# Patient Record
Sex: Female | Born: 1960 | Race: Black or African American | Hispanic: No | Smoking: Never smoker
Health system: Southern US, Community
[De-identification: ages and names within clinical notes are randomized; demographics above are authoritative.]

## PROBLEM LIST (undated history)

## (undated) DIAGNOSIS — Z9889 Other specified postprocedural states: Secondary | ICD-10-CM

## (undated) DIAGNOSIS — K219 Gastro-esophageal reflux disease without esophagitis: Secondary | ICD-10-CM

## (undated) DIAGNOSIS — I10 Essential (primary) hypertension: Secondary | ICD-10-CM

## (undated) DIAGNOSIS — R112 Nausea with vomiting, unspecified: Secondary | ICD-10-CM

## (undated) HISTORY — PX: CARPAL TUNNEL RELEASE: SHX101

## (undated) HISTORY — PX: ABDOMINAL HYSTERECTOMY: SHX81

## (undated) HISTORY — PX: PARTIAL KNEE ARTHROPLASTY: SHX2174

## (undated) HISTORY — PX: NASAL SINUS SURGERY: SHX719

## (undated) HISTORY — PX: THUMB ARTHROSCOPY: SHX2509

---

## 2004-06-13 ENCOUNTER — Ambulatory Visit: Payer: Self-pay

## 2005-09-04 ENCOUNTER — Ambulatory Visit: Payer: Self-pay | Admitting: Otolaryngology

## 2006-12-03 ENCOUNTER — Ambulatory Visit: Payer: Self-pay | Admitting: Specialist

## 2006-12-10 ENCOUNTER — Ambulatory Visit: Payer: Self-pay | Admitting: Specialist

## 2010-04-03 ENCOUNTER — Ambulatory Visit: Payer: Self-pay | Admitting: Internal Medicine

## 2011-01-17 ENCOUNTER — Ambulatory Visit: Payer: Self-pay | Admitting: Rheumatology

## 2011-02-13 ENCOUNTER — Ambulatory Visit: Payer: Self-pay | Admitting: Unknown Physician Specialty

## 2011-07-08 ENCOUNTER — Ambulatory Visit: Payer: Self-pay | Admitting: Unknown Physician Specialty

## 2011-07-31 ENCOUNTER — Ambulatory Visit: Payer: Self-pay | Admitting: Unknown Physician Specialty

## 2012-10-15 ENCOUNTER — Ambulatory Visit: Payer: Self-pay | Admitting: Specialist

## 2012-10-15 LAB — POTASSIUM: Potassium: 3.5 mmol/L (ref 3.5–5.1)

## 2012-10-22 ENCOUNTER — Ambulatory Visit: Payer: Self-pay | Admitting: Specialist

## 2012-11-17 ENCOUNTER — Encounter: Payer: Self-pay | Admitting: Specialist

## 2012-12-08 ENCOUNTER — Encounter: Payer: Self-pay | Admitting: Specialist

## 2013-01-07 ENCOUNTER — Encounter: Payer: Self-pay | Admitting: Specialist

## 2014-11-14 ENCOUNTER — Ambulatory Visit (INDEPENDENT_AMBULATORY_CARE_PROVIDER_SITE_OTHER): Payer: Federal, State, Local not specified - PPO

## 2014-11-14 ENCOUNTER — Ambulatory Visit (INDEPENDENT_AMBULATORY_CARE_PROVIDER_SITE_OTHER): Payer: Federal, State, Local not specified - PPO | Admitting: Podiatry

## 2014-11-14 ENCOUNTER — Encounter: Payer: Self-pay | Admitting: Podiatry

## 2014-11-14 VITALS — BP 151/84 | HR 62 | Resp 16

## 2014-11-14 DIAGNOSIS — M79671 Pain in right foot: Secondary | ICD-10-CM

## 2014-11-14 DIAGNOSIS — M722 Plantar fascial fibromatosis: Secondary | ICD-10-CM | POA: Diagnosis not present

## 2014-11-14 DIAGNOSIS — M779 Enthesopathy, unspecified: Secondary | ICD-10-CM

## 2014-11-14 NOTE — Progress Notes (Signed)
She presents today for follow-up of capsulitis sub-second metatarsophalangeal joint of the right foot. She states there is doing good for a while and now my foot is starting to hurt again. She denies any changes in her past medical history medications allergy surgery social history or trauma to the foot.  Objective: Vital signs are stable she is alert and oriented 3. Pulses are palpable bilateral. She is pain on palpation and in range of motion of the second metatarsophalangeal joint of the right foot. Radiographic evaluation confirms elongated second metatarsal with soft tissue increase in density periarticular.  Assessment: Capsulitis second metatarsophalangeal joint right foot.  Plan: Injected the joint today with 2 mg of dexamethasone and local anesthetic after sterile Betadine skin prep. She was also scanned for pair of orthotics.

## 2014-12-12 ENCOUNTER — Ambulatory Visit (INDEPENDENT_AMBULATORY_CARE_PROVIDER_SITE_OTHER): Payer: Federal, State, Local not specified - PPO | Admitting: Podiatry

## 2014-12-12 DIAGNOSIS — M779 Enthesopathy, unspecified: Secondary | ICD-10-CM

## 2014-12-12 DIAGNOSIS — M722 Plantar fascial fibromatosis: Secondary | ICD-10-CM | POA: Diagnosis not present

## 2014-12-12 NOTE — Progress Notes (Signed)
Orthotics dispensed. Slow breakin. See in 1 mo. For follow.  Objective evaluation demonstrates palpable pulses. She still has tenderness on palpation of the second metatarsophalangeal joint of the right foot.  Assessment: Capsulitis right foot second metatarsophalangeal joint.  Plan: Dispensed orthotics today and will follow-up with her in 1 month at which time reaming a need to reinject the joint.

## 2014-12-12 NOTE — Patient Instructions (Signed)

## 2014-12-30 NOTE — Op Note (Signed)
PATIENT NAME:  Marland KitchenJENKINS, Shirley M MR#:  [id number] DATE OF BIRTH:  12-23-1960  DATE OF PROCEDURE:  10/22/2012  PREOPERATIVE DIAGNOSIS: Right carpal tunnel syndrome.   POSTOPERATIVE DIAGNOSIS: Right carpal tunnel syndrome with excess synovitis.  OPERATION: Right carpal tunnel release with partial synovectomy.   SURGEON: Valinda HoarHoward E. Cyan Clippinger, M.D.   ANESTHESIA: General LMA.   COMPLICATIONS: None.   DRAINS: None.   DESCRIPTION OF PROCEDURE: The patient was brought to the operating room where she underwent satisfactory general LMA anesthesia, in the supine position. The right arm was prepped and draped in sterile fashion. Esmarch was applied and the tourniquet inflated to 250 mmHg. Tourniquet time was 22 minutes. A longitudinal incision was made in the palm using the patient's long palmar crease. Dissection was carried out bluntly through subcutaneous tissue using loop magnification. The distal aspect of the volar carpal ligament was identified and a Kelly clamp passed beneath it. The soft tissues were elevated off the volar ligament and the mini blade knife was used to release the ligament distally. Carpal tunnel scissors were used proximally under direct vision. The nerve was seen to be adhesed. A mosquito clamp was used to free this up from adhesions. In addition, there excess synovitis around the flexor tendons and a portion of this was excised. Motor branch was intact. The wound was then irrigated and closed with running 5-0 nylon suture. 0.5% Marcaine was placed in the wound and a dry sterile compression hand dressing with volar splint was applied. The tourniquet was deflated with good return of blood flow to the hand. The patient was awakened and taken to recovery in good condition. ____________________________ Valinda HoarHoward E. Haevyn Ury, MD hem:sb D: 10/22/2012 08:44:09 ET     T: 10/22/2012 09:07:01 ET         JOB#: [id number] cc: Valinda HoarHoward E. Mayleigh Tetrault, MD, <Dictator> Valinda HoarHOWARD E Anitra Doxtater MD ELECTRONICALLY SIGNED  10/23/2012 15:12

## 2015-01-11 ENCOUNTER — Ambulatory Visit (INDEPENDENT_AMBULATORY_CARE_PROVIDER_SITE_OTHER): Payer: Federal, State, Local not specified - PPO | Admitting: Podiatry

## 2015-01-11 ENCOUNTER — Encounter: Payer: Self-pay | Admitting: Podiatry

## 2015-01-11 VITALS — BP 135/77 | HR 64 | Resp 16

## 2015-01-11 DIAGNOSIS — M779 Enthesopathy, unspecified: Secondary | ICD-10-CM | POA: Diagnosis not present

## 2015-01-11 NOTE — Progress Notes (Signed)
She presents today for follow-up of capsulitis second metatarsophalangeal joint of the right foot she states this seems to be doing much better. She states that the orthotics are comfortable and she wears and the majority of the time. She did not bring them with her today.  Objective: Vital signs are stable she is alert and oriented 3 much decrease in edema and erythema to the second metatarsophalangeal joint of the right foot mild tenderness on dorsiflexion plantar flexion and there is some fluid that is palpable within the joint.  Assessment well-healing capsulitis second metatarsophalangeal joint right foot.  Plan: Continue use of the orthotics and anti-inflammatories follow up with me as needed.

## 2015-01-30 ENCOUNTER — Other Ambulatory Visit: Payer: Self-pay

## 2015-01-31 ENCOUNTER — Other Ambulatory Visit: Payer: Self-pay

## 2015-01-31 ENCOUNTER — Encounter: Payer: Self-pay | Admitting: *Deleted

## 2015-01-31 DIAGNOSIS — I10 Essential (primary) hypertension: Secondary | ICD-10-CM | POA: Diagnosis not present

## 2015-01-31 DIAGNOSIS — Z882 Allergy status to sulfonamides status: Secondary | ICD-10-CM | POA: Diagnosis not present

## 2015-01-31 DIAGNOSIS — K219 Gastro-esophageal reflux disease without esophagitis: Secondary | ICD-10-CM | POA: Diagnosis not present

## 2015-01-31 DIAGNOSIS — G5631 Lesion of radial nerve, right upper limb: Secondary | ICD-10-CM | POA: Diagnosis not present

## 2015-01-31 DIAGNOSIS — Z880 Allergy status to penicillin: Secondary | ICD-10-CM | POA: Diagnosis not present

## 2015-01-31 NOTE — Patient Instructions (Signed)
  Your procedure is scheduled on: 02-08-15 Report to [facility] 2ND FLOOR To find out your arrival time please call [phone] between 1PM - 3PM on 02-07-15 (TUESDAY).  Remember: Instructions that are not followed completely may result in serious medical risk, up to and including death, or upon the discretion of your surgeon and anesthesiologist your surgery may need to be rescheduled.    __X__ 1. Do not eat food or drink liquids after midnight. No gum chewing or hard candies.     __X__ 2. No Alcohol for 24 hours before or after surgery.   ____ 3. Bring all medications with you on the day of surgery if instructed.    __X__ 4. Notify your doctor if there is any change in your medical condition     (cold, fever, infections).     Do not wear jewelry, make-up, hairpins, clips or nail polish.  Do not wear lotions, powders, or perfumes. You may wear deodorant.  Do not shave 48 hours prior to surgery. Men may shave face and neck.  Do not bring valuables to the hospital.    [facility] is not responsible for any belongings or valuables.               Contacts, dentures or bridgework may not be worn into surgery.  Leave your suitcase in the car. After surgery it may be brought to your room.  For patients admitted to the hospital, discharge time is determined by your treatment team.   Patients discharged the day of surgery will not be allowed to drive home.   Please read over the following fact sheets that you were given:    CHG INSTRUCTIONS  ____ Take these medicines the morning of surgery with A SIP OF WATER: NONE   1.  2.   3.   4.  5.  6.  ____ Fleet Enema (as directed)   __X__ Use CHG Soap as directed  ____ Use inhalers on the day of surgery  ____ Stop metformin 2 days prior to surgery    ____ Take 1/2 of usual insulin dose the night before surgery and none on the morning of surgery.   _X___ Stop Coumadin/Plavix/aspirin NOW  _X__ Stop  Anti-inflammatories NOW (MOBIC)-NO NSAIDS OR ASPIRIN PRODUCTS (TYLENOL OK)   _X___ Stop supplements until after surgery.  (FISH OIL)  ____ Bring C-Pap to the hospital.

## 2015-02-02 ENCOUNTER — Encounter
Admission: RE | Admit: 2015-02-02 | Discharge: 2015-02-02 | Disposition: A | Discharge status: Home / Self Care | Payer: Federal, State, Local not specified - PPO | Source: Ambulatory Visit | Attending: Anesthesiology | Admitting: Anesthesiology

## 2015-02-02 DIAGNOSIS — Z01810 Encounter for preprocedural cardiovascular examination: Secondary | ICD-10-CM | POA: Insufficient documentation

## 2015-02-02 DIAGNOSIS — I10 Essential (primary) hypertension: Secondary | ICD-10-CM | POA: Diagnosis not present

## 2015-02-02 DIAGNOSIS — G5631 Lesion of radial nerve, right upper limb: Secondary | ICD-10-CM | POA: Diagnosis not present

## 2015-02-02 LAB — POTASSIUM: POTASSIUM: 3.3 mmol/L — AB (ref 3.5–5.1)

## 2015-02-03 NOTE — OR Nursing (Signed)
pts potassium 3.3. Called Tabitha at [facility] and left her a message regarding low potassium and faxed result over to office

## 2015-02-13 ENCOUNTER — Ambulatory Visit: Payer: Federal, State, Local not specified - PPO | Admitting: Anesthesiology

## 2015-02-13 ENCOUNTER — Ambulatory Visit
Admission: RE | Admit: 2015-02-13 | Discharge: 2015-02-13 | Disposition: A | Discharge status: Home / Self Care | Payer: Federal, State, Local not specified - PPO | Source: Ambulatory Visit | Attending: Specialist | Admitting: Specialist

## 2015-02-13 ENCOUNTER — Encounter
Admission: RE | Disposition: A | Discharge status: Home / Self Care | Payer: Self-pay | Source: Ambulatory Visit | Attending: Specialist

## 2015-02-13 DIAGNOSIS — G5631 Lesion of radial nerve, right upper limb: Secondary | ICD-10-CM | POA: Diagnosis not present

## 2015-02-13 DIAGNOSIS — I10 Essential (primary) hypertension: Secondary | ICD-10-CM | POA: Insufficient documentation

## 2015-02-13 DIAGNOSIS — Z882 Allergy status to sulfonamides status: Secondary | ICD-10-CM | POA: Insufficient documentation

## 2015-02-13 DIAGNOSIS — K219 Gastro-esophageal reflux disease without esophagitis: Secondary | ICD-10-CM | POA: Insufficient documentation

## 2015-02-13 DIAGNOSIS — Z880 Allergy status to penicillin: Secondary | ICD-10-CM | POA: Insufficient documentation

## 2015-02-13 HISTORY — PX: NERVE REPAIR: SHX2083

## 2015-02-13 HISTORY — DX: Other specified postprocedural states: Z98.890

## 2015-02-13 HISTORY — DX: Essential (primary) hypertension: I10

## 2015-02-13 HISTORY — DX: Nausea with vomiting, unspecified: R11.2

## 2015-02-13 HISTORY — DX: Gastro-esophageal reflux disease without esophagitis: K21.9

## 2015-02-13 LAB — POTASSIUM: Potassium, Ser: 3.7

## 2015-02-13 SURGERY — REPAIR, NERVE
Anesthesia: General | Laterality: Right | Wound class: Clean

## 2015-02-13 MED ORDER — ONDANSETRON HCL 4 MG/2ML IJ SOLN
INTRAMUSCULAR | Status: DC | PRN
  Administered 2015-02-13: 4 mg via INTRAVENOUS

## 2015-02-13 MED ORDER — ATENOLOL 50 MG PO TABS
ORAL_TABLET | ORAL | Status: AC
  Filled 2015-02-13: qty 1

## 2015-02-13 MED ORDER — PHENYLEPHRINE HCL 10 MG/ML IJ SOLN
INTRAMUSCULAR | Status: DC | PRN
  Administered 2015-02-13: 100 ug via INTRAVENOUS

## 2015-02-13 MED ORDER — TRAMADOL HCL 50 MG PO TABS: 50.0000 mg | ORAL_TABLET | Freq: Four times a day (QID) | ORAL | Status: DC | PRN

## 2015-02-13 MED ORDER — GABAPENTIN 400 MG PO CAPS: 400.0000 mg | ORAL_CAPSULE | Freq: Three times a day (TID) | ORAL | Status: DC

## 2015-02-13 MED ORDER — MIDAZOLAM HCL 2 MG/2ML IJ SOLN
INTRAMUSCULAR | Status: DC | PRN
  Administered 2015-02-13: 2 mg via INTRAVENOUS

## 2015-02-13 MED ORDER — ATENOLOL 50 MG PO TABS
25.0000 mg | ORAL_TABLET | Freq: Once | ORAL | Status: AC
  Administered 2015-02-13: 14:00:00 via ORAL

## 2015-02-13 MED ORDER — FENTANYL CITRATE (PF) 100 MCG/2ML IJ SOLN
INTRAMUSCULAR | Status: AC
  Filled 2015-02-13: qty 2

## 2015-02-13 MED ORDER — CLINDAMYCIN PHOSPHATE 900 MG/50ML IV SOLN
INTRAVENOUS | Status: AC
Start: 2015-02-13 — End: 2015-02-13
  Administered 2015-02-13: 900 mg via INTRAVENOUS
  Filled 2015-02-13: qty 50

## 2015-02-13 MED ORDER — ONDANSETRON HCL 4 MG/2ML IJ SOLN
INTRAMUSCULAR | Status: AC
  Administered 2015-02-13: 4 mg via INTRAVENOUS
  Filled 2015-02-13: qty 2

## 2015-02-13 MED ORDER — CLINDAMYCIN PHOSPHATE 900 MG/50ML IV SOLN
900.0000 mg | Freq: Once | INTRAVENOUS | Status: AC
  Administered 2015-02-13: 900 mg via INTRAVENOUS

## 2015-02-13 MED ORDER — LIDOCAINE HCL (CARDIAC) 20 MG/ML IV SOLN
INTRAVENOUS | Status: DC | PRN
  Administered 2015-02-13: 50 mg via INTRAVENOUS

## 2015-02-13 MED ORDER — BUPIVACAINE HCL (PF) 0.5 % IJ SOLN
INTRAMUSCULAR | Status: AC
  Filled 2015-02-13: qty 30

## 2015-02-13 MED ORDER — BUPIVACAINE HCL 0.5 % IJ SOLN
INTRAMUSCULAR | Status: DC | PRN
  Administered 2015-02-13: 20 mL

## 2015-02-13 MED ORDER — FAMOTIDINE 20 MG PO TABS
ORAL_TABLET | ORAL | Status: AC
  Administered 2015-02-13: 20 mg via ORAL
  Filled 2015-02-13: qty 1

## 2015-02-13 MED ORDER — GABAPENTIN 400 MG PO CAPS
400.0000 mg | ORAL_CAPSULE | Freq: Once | ORAL | Status: AC
  Administered 2015-02-13: 400 mg via ORAL

## 2015-02-13 MED ORDER — MELOXICAM 7.5 MG PO TABS
ORAL_TABLET | ORAL | Status: AC
  Administered 2015-02-13: 7.5 mg via ORAL
  Filled 2015-02-13: qty 2

## 2015-02-13 MED ORDER — GLYCOPYRROLATE 0.2 MG/ML IJ SOLN
INTRAMUSCULAR | Status: DC | PRN
  Administered 2015-02-13: 0.2 mg via INTRAVENOUS

## 2015-02-13 MED ORDER — GABAPENTIN 400 MG PO CAPS
ORAL_CAPSULE | ORAL | Status: AC
  Administered 2015-02-13: 400 mg via ORAL
  Filled 2015-02-13: qty 1

## 2015-02-13 MED ORDER — PROPOFOL 10 MG/ML IV BOLUS
INTRAVENOUS | Status: DC | PRN
  Administered 2015-02-13: 150 mg via INTRAVENOUS

## 2015-02-13 MED ORDER — DEXAMETHASONE SODIUM PHOSPHATE 4 MG/ML IJ SOLN
INTRAMUSCULAR | Status: DC | PRN
  Administered 2015-02-13: 5 mg via INTRAVENOUS

## 2015-02-13 MED ORDER — LACTATED RINGERS IV SOLN
INTRAVENOUS | Status: DC | PRN
  Administered 2015-02-13 (×2): via INTRAVENOUS

## 2015-02-13 MED ORDER — FENTANYL CITRATE (PF) 100 MCG/2ML IJ SOLN
INTRAMUSCULAR | Status: DC | PRN
  Administered 2015-02-13 (×3): 50 ug via INTRAVENOUS

## 2015-02-13 MED ORDER — LACTATED RINGERS IV SOLN
Freq: Once | INTRAVENOUS | Status: AC
  Administered 2015-02-13: 14:00:00 via INTRAVENOUS

## 2015-02-13 MED ORDER — EPHEDRINE SULFATE 50 MG/ML IJ SOLN
INTRAMUSCULAR | Status: DC | PRN
  Administered 2015-02-13: 10 mg via INTRAVENOUS

## 2015-02-13 MED ORDER — BUPIVACAINE HCL (PF) 0.25 % IJ SOLN
INTRAMUSCULAR | Status: AC
  Filled 2015-02-13: qty 30

## 2015-02-13 MED ORDER — ONDANSETRON HCL 4 MG/2ML IJ SOLN
4.0000 mg | Freq: Once | INTRAMUSCULAR | Status: AC | PRN
  Administered 2015-02-13: 4 mg via INTRAVENOUS

## 2015-02-13 MED ORDER — MELOXICAM 7.5 MG PO TABS
15.0000 mg | ORAL_TABLET | Freq: Once | ORAL | Status: AC
  Administered 2015-02-13: 7.5 mg via ORAL

## 2015-02-13 MED ORDER — NEOMYCIN-POLYMYXIN B GU 40-200000 IR SOLN
Status: DC | PRN
  Administered 2015-02-13: 2 mL

## 2015-02-13 MED ORDER — FENTANYL CITRATE (PF) 100 MCG/2ML IJ SOLN
25.0000 ug | INTRAMUSCULAR | Status: DC | PRN
  Administered 2015-02-13 (×4): 25 ug via INTRAVENOUS

## 2015-02-13 MED ORDER — FAMOTIDINE 20 MG PO TABS
20.0000 mg | ORAL_TABLET | Freq: Once | ORAL | Status: AC
  Administered 2015-02-13: 20 mg via ORAL

## 2015-02-13 SURGICAL SUPPLY — 22 items
BLADE SURG MINI STRL (BLADE) ×3 IMPLANT
BNDG ESMARK 4X12 TAN STRL LF (GAUZE/BANDAGES/DRESSINGS) ×3 IMPLANT
CHLORAPREP W/TINT 26ML (MISCELLANEOUS) ×3 IMPLANT
GAUZE FLUFF 18X24 1PLY STRL (GAUZE/BANDAGES/DRESSINGS) ×3 IMPLANT
GAUZE PETRO XEROFOAM 1X8 (MISCELLANEOUS) ×3 IMPLANT
GLOVE BIO SURGEON STRL SZ7.5 (GLOVE) ×3 IMPLANT
GOWN STRL REUS W/ TWL LRG LVL3 (GOWN DISPOSABLE) ×2 IMPLANT
GOWN STRL REUS W/TWL LRG LVL3 (GOWN DISPOSABLE) ×4
KIT RM TURNOVER STRD PROC AR (KITS) ×3 IMPLANT
NS IRRIG 500ML POUR BTL (IV SOLUTION) ×3 IMPLANT
PACK EXTREMITY ARMC (MISCELLANEOUS) ×3 IMPLANT
PAD PREP 24X41 OB/GYN DISP (PERSONAL CARE ITEMS) ×3 IMPLANT
PADDING CAST 4IN STRL (MISCELLANEOUS) ×4
PADDING CAST BLEND 4X4 STRL (MISCELLANEOUS) ×2 IMPLANT
SPLINT CAST 1 STEP 4X30 (MISCELLANEOUS) ×3 IMPLANT
STAPLER SKIN PROX 35W (STAPLE) ×3 IMPLANT
STOCKINETTE BIAS CUT 4 980044 (GAUZE/BANDAGES/DRESSINGS) ×3 IMPLANT
STOCKINETTE STRL 4IN 9604848 (GAUZE/BANDAGES/DRESSINGS) ×3 IMPLANT
SUT MNCRL+ 5-0 VIOLET P-3 (SUTURE) IMPLANT
SUT MONOCRYL 5-0 (SUTURE)
SUT VIC AB 3-0 SH 27 (SUTURE) ×2
SUT VIC AB 3-0 SH 27X BRD (SUTURE) ×1 IMPLANT

## 2015-02-13 NOTE — Transfer of Care (Signed)
Immediate Anesthesia Transfer of Care Note  Patient: Shirley KitchenSheena M Kaiser  Procedure(s) Performed: Procedure(s) with comments: NERVE REPAIR (Right) - radial nerve  Patient Location: PACU  Anesthesia Type:General  Level of Consciousness: Alert, Awake, Oriented  Airway & Oxygen Therapy: Patient Spontanous Breathing  Post-op Assessment: Report given to RN  Post vital signs: Reviewed and stable  Last Vitals:  Filed Vitals:   02/13/15 1639  BP:   Pulse: 67  Temp: 36.8 C  Resp: 18    Complications: No apparent anesthesia complications

## 2015-02-13 NOTE — Anesthesia Postprocedure Evaluation (Signed)
  Anesthesia Post-op Note  Patient: Marland KitchenSheena M Kaiser  Procedure(s) Performed: Procedure(s) with comments: NERVE REPAIR (Right) - radial nerve  Anesthesia type:General  Patient location: PACU  Post pain: Pain level controlled  Post assessment: Post-op Vital signs reviewed, Patient's Cardiovascular Status Stable, Respiratory Function Stable, Patent Airway and No signs of Nausea or vomiting  Post vital signs: Reviewed and stable  Last Vitals:  Filed Vitals:   02/13/15 1639  BP:   Pulse: 67  Temp: 36.8 C  Resp: 18    Level of consciousness: awake, alert  and patient cooperative  Complications: No apparent anesthesia complications

## 2015-02-13 NOTE — OR Nursing (Signed)
Patient able to move fingers

## 2015-02-13 NOTE — Anesthesia Preprocedure Evaluation (Signed)
Anesthesia Evaluation  Patient identified by MRN, date of birth, ID band Patient awake    Reviewed: Allergy & Precautions, NPO status , Patient's Chart, lab work & pertinent test results  History of Anesthesia Complications (+) PONV  Airway Mallampati: II  TM Distance: >3 FB Neck ROM: Full    Dental  (+) Partial Lower   Pulmonary          Cardiovascular hypertension, Pt. on medications and Pt. on home beta blockers     Neuro/Psych    GI/Hepatic GERD- (improved, no meds x 2 yrs)  ,  Endo/Other    Renal/GU      Musculoskeletal   Abdominal   Peds  Hematology   Anesthesia Other Findings   Reproductive/Obstetrics                             Anesthesia Physical Anesthesia Plan  ASA: II  Anesthesia Plan: General   Post-op Pain Management:    Induction: Intravenous  Airway Management Planned: LMA  Additional Equipment:   Intra-op Plan:   Post-operative Plan:   Informed Consent: I have reviewed the patients History and Physical, chart, labs and discussed the procedure including the risks, benefits and alternatives for the proposed anesthesia with the patient or authorized representative who has indicated his/her understanding and acceptance.     Plan Discussed with:   Anesthesia Plan Comments:         Anesthesia Quick Evaluation

## 2015-02-13 NOTE — Op Note (Signed)
02/13/2015  4:35 PM  PATIENT:  Marland KitchenSheena M Danh  54 y.o. female  PRE-OPERATIVE DIAGNOSIS:  radial nerve compression  POST-OPERATIVE DIAGNOSIS:  Same  PROCEDURE: Release right radial nerve below elbow   SURGEON:  Lyrical Sowle E Jarica Plass MD   ANESTHESIA:   General  Complications:  None  Blood loss :None  Procedure: The patient was brought to the operating room and underwent general LMA anesthesia satisfactorily.  The right arm was prepped and draped in sterile fashion.  Esmarch was applied and tourniquet inflated to 250 mmHg.  A longitudinal incision was made over the dorsal radial aspect of the proximal forearm.  Dissection was carried out bluntly through subcutaneous tissue with protection of nerves.  Muscle fascia was incised and muscle compartment, separated.  Dissection was carried out bluntly deepened down to the radial nerve.  Both the sensory and motor branches were identified and were released from the point of split distally for several centimeters.  Tight fascia over the motor branch was released.  Blunt finger dissection more distally did not reveal any significant constriction.  The wound was then irrigated and closed with 30 Vicryls and staples.  Half percent Sensorcaine was instilled.  A dry sterile dressing was applied with a sugar tong splint.  Tourniquet time was 40 minutes.  The patient was placed in a sling and awakened and taken to recovery in good condition.  Valinda HoarHoward E Lorretta Kerce, M.D.   DICTATION #:  [id number] [id number]

## 2015-02-13 NOTE — Discharge Instructions (Signed)
AMBULATORY SURGERY  DISCHARGE INSTRUCTIONS   1) The drugs that you were given will stay in your system until tomorrow so for the next 24 hours you should not:  A) Drive an automobile B) Make any legal decisions C) Drink any alcoholic beverage   2) You may resume regular meals tomorrow.  Today it is better to start with liquids and gradually work up to solid foods.  You may eat anything you prefer, but it is better to start with liquids, then soup and crackers, and gradually work up to solid foods.   3) Please notify your doctor immediately if you have any unusual bleeding, trouble breathing, redness and pain at the surgery site, drainage, fever, or pain not relieved by medication.  4) Your post-operative visit with Dr.                                     is: Date:                        Time:    Please call to schedule your post-operative visit.  5) Additional Instructions:Elevate arm on two pillows. 6)

## 2015-02-13 NOTE — Anesthesia Procedure Notes (Signed)
Procedure Name: LMA Insertion Date/Time: 02/13/2015 3:30 PM Performed by: Mathews ArgyleLOGAN, Shirley Heckert Pre-anesthesia Checklist: Patient identified, Emergency Drugs available, Suction available and Patient being monitored Patient Re-evaluated:Patient Re-evaluated prior to inductionOxygen Delivery Method: Circle system utilized Preoxygenation: Pre-oxygenation with 100% oxygen Intubation Type: IV induction LMA: LMA inserted LMA Size: 4.0 Placement Confirmation: positive ETCO2 and breath sounds checked- equal and bilateral Tube secured with: Tape Dental Injury: Teeth and Oropharynx as per pre-operative assessment

## 2015-02-14 ENCOUNTER — Encounter: Payer: Self-pay | Admitting: Specialist

## 2015-02-20 ENCOUNTER — Ambulatory Visit (INDEPENDENT_AMBULATORY_CARE_PROVIDER_SITE_OTHER): Payer: Federal, State, Local not specified - PPO | Admitting: Podiatry

## 2015-02-20 ENCOUNTER — Encounter: Payer: Self-pay | Admitting: Podiatry

## 2015-02-20 VITALS — BP 104/85 | HR 57 | Resp 16

## 2015-02-20 DIAGNOSIS — M779 Enthesopathy, unspecified: Secondary | ICD-10-CM

## 2015-02-20 NOTE — Progress Notes (Signed)
She presents today out of work recovering from right elbow surgery. She states that my second knuckle still hurts. This will be the prime time to have an injection because I will be out of work.  Objective: No signs are stable she is alert and oriented 3. Pulses are palpable right foot. She has pain on palpation and in the range of motion of the second metatarsophalangeal joint of the right foot.  Assessment: Capsulitis second metatarsophalangeal joint right foot.  Plan: Injected the second metatarsophalangeal joint today with 2 mg of dexamethasone and local aspect. And we'll follow-up with her in 1 month.

## 2015-03-29 ENCOUNTER — Ambulatory Visit: Payer: Federal, State, Local not specified - PPO | Admitting: Podiatry

## 2015-04-05 ENCOUNTER — Ambulatory Visit (INDEPENDENT_AMBULATORY_CARE_PROVIDER_SITE_OTHER): Payer: Federal, State, Local not specified - PPO | Admitting: Podiatry

## 2015-04-05 VITALS — BP 125/87 | HR 69 | Resp 16

## 2015-04-05 DIAGNOSIS — M779 Enthesopathy, unspecified: Secondary | ICD-10-CM | POA: Diagnosis not present

## 2015-04-05 NOTE — Progress Notes (Signed)
She presents today for follow-up of her capsulitis second metatarsophalangeal joint of her right foot. She states that is not doing very well.  Objective: Vital signs are stable she's alert and oriented 3 pulses are palpable right. She has swelling over the second metatarsophalangeal joint of the right foot with palpable fluid retention. Pain on end range of motion of the second metatarsophalangeal joint.  Assessment: Capsulitis second metatarsophalangeal joint right foot chronic.  Plan: Local anesthesia was administered periarticular about the second metatarsophalangeal joint today the contents of the joint was aspirated after sterile Betadine skin prep and then reinjected with Kenalog. I will follow-up with her in 3-4 weeks.

## 2015-04-26 ENCOUNTER — Encounter: Payer: Self-pay | Admitting: Podiatry

## 2015-04-26 ENCOUNTER — Ambulatory Visit (INDEPENDENT_AMBULATORY_CARE_PROVIDER_SITE_OTHER): Payer: Federal, State, Local not specified - PPO | Admitting: Podiatry

## 2015-04-26 VITALS — BP 166/98 | HR 68 | Resp 16

## 2015-04-26 DIAGNOSIS — M779 Enthesopathy, unspecified: Secondary | ICD-10-CM

## 2015-04-26 NOTE — Progress Notes (Signed)
She presents today for a follow-up of her capsulitis second digit of the right foot. She states that is doing much better. She does not have to take any medications and presents today in flip flops.  Objective: Vital signs are stable she is alert and oriented 3. Pulses are strongly palpable. Neurologic sensorium is intact deep tendon reflexes are intact bilateral muscle strength +5 over 5 dorsiflexion plantar flexors and inverters everters all into the musculature is intact. Orthopedic evaluation demonstrates all joints of the ankle for range of motion without crepitation without pain. She has no pain on end range of motion of the second metatarsophalangeal joint of the right foot.  Assessment: Well-healing capsulitis second metatarsophalangeal joint right foot. No further treatment is necessary.  Plan: She will follow up with [place] on an as-needed basis.  Dr. Arbutus Ped

## 2015-12-25 ENCOUNTER — Ambulatory Visit (INDEPENDENT_AMBULATORY_CARE_PROVIDER_SITE_OTHER): Payer: Federal, State, Local not specified - PPO | Admitting: Podiatry

## 2015-12-25 ENCOUNTER — Ambulatory Visit (INDEPENDENT_AMBULATORY_CARE_PROVIDER_SITE_OTHER): Payer: Federal, State, Local not specified - PPO

## 2015-12-25 ENCOUNTER — Encounter: Payer: Self-pay | Admitting: Podiatry

## 2015-12-25 VITALS — BP 118/64 | HR 72 | Resp 12

## 2015-12-25 DIAGNOSIS — M722 Plantar fascial fibromatosis: Secondary | ICD-10-CM

## 2015-12-25 MED ORDER — MELOXICAM 15 MG PO TABS: 15.0000 mg | ORAL_TABLET | Freq: Every day | ORAL | Status: AC

## 2015-12-25 MED ORDER — METHYLPREDNISOLONE 4 MG PO TBPK: ORAL_TABLET | ORAL | Status: DC

## 2015-12-25 NOTE — Progress Notes (Signed)
She presents today with a 4 month duration of bilateral plantar foot pain. She's done nothing to treat it. She states that the majority of the pain first thing in the mornings and after she's been on her feet for extended period of time. She states that she has to wear house slippers or flip flops during her lunch hour.  Objective: Vital signs stable alert and oriented 3. Pulses are palpable. I have reviewed her past medical history medications allergies surgeon social history. She is pain on palpation medial calcaneal tubercle bilateral heels. Radiographs demonstrate pes planus with a soft tissue increase in density of the plantar fascial calcaneal insertion site. This is indicative of plantar fasciitis.  Assessment: Pain in limb secondary to plantar fasciitis and pes planus.  Plan: Injected bilateral heels today with Kenalog and local anesthetic started her on a Medrol Dosepak to be followed by meloxicam. Placed her in bilateral plantar fascial braces and a night splint. We discussed appropriate shoe gear stretching exercises ice therapy and sugar modifications area

## 2015-12-25 NOTE — Patient Instructions (Signed)

## 2016-01-24 ENCOUNTER — Encounter: Payer: Self-pay | Admitting: Podiatry

## 2016-01-24 ENCOUNTER — Ambulatory Visit (INDEPENDENT_AMBULATORY_CARE_PROVIDER_SITE_OTHER): Payer: Federal, State, Local not specified - PPO | Admitting: Podiatry

## 2016-01-24 DIAGNOSIS — M722 Plantar fascial fibromatosis: Secondary | ICD-10-CM

## 2016-01-24 DIAGNOSIS — M779 Enthesopathy, unspecified: Secondary | ICD-10-CM | POA: Diagnosis not present

## 2016-01-24 NOTE — Progress Notes (Signed)
She presents today for follow-up of her plantar fasciitis bilaterally. He states that her left wrist doing well her right foot however is still painful. She states that she has not been wearing her plantar fascia braces correctly.  Objective: Vital signs are stable she is alert and oriented 3. Pulses are palpable. His pain on palpation medial calcaneal tubercle of the right heel as well as tenderness on palpation of the second metatarsophalangeal joint right foot.  Assessment: Capsulitis second metatarsophalangeal joint right foot. Recurrence of plantar fascitis right heel.  Plan: Continue anti-inflammatories demonstrated the plantar fascial braces once again to her I injected dexamethasone second metatarsophalangeal joint as well as Kenalog to the right heel. Follow up with her in 4-6 weeks.

## 2016-02-28 ENCOUNTER — Ambulatory Visit: Payer: Federal, State, Local not specified - PPO | Admitting: Podiatry

## 2016-03-06 ENCOUNTER — Encounter: Payer: Self-pay | Admitting: Podiatry

## 2016-03-06 ENCOUNTER — Ambulatory Visit (INDEPENDENT_AMBULATORY_CARE_PROVIDER_SITE_OTHER): Payer: Federal, State, Local not specified - PPO | Admitting: Podiatry

## 2016-03-06 DIAGNOSIS — M722 Plantar fascial fibromatosis: Secondary | ICD-10-CM

## 2016-03-06 DIAGNOSIS — M779 Enthesopathy, unspecified: Secondary | ICD-10-CM

## 2016-03-06 MED ORDER — DICLOFENAC SODIUM 75 MG PO TBEC: 75.0000 mg | DELAYED_RELEASE_TABLET | Freq: Two times a day (BID) | ORAL | Status: DC

## 2016-03-06 NOTE — Progress Notes (Signed)
She presents today for follow-up of her plantar fasciitis bilaterally and capsulitis second metatarsophalangeal joint right foot. She states that the capsulitis is 100% resolved and her plantar fasciitis is a proximal 95% resolved. Her graft objective: Vital signs are stable she is alert and oriented 3. Pulses are palpable. She has pain on palpation medial continued tubercles bilaterally but much decreased from previous evaluations. No pain on palpation or range of motion of the second metatarsophalangeal joint of the right foot. No open lesions or wounds.  Assessment: Resolving plantar fasciitis 90-95% bilateral. Resolved capsulitis second metatarsophalangeal joint of the right foot.  Plan: I encouraged her to discontinue use of the plantar fascial brace and utilize her orthotics on a regular basis area also recommended that she start diclofenac which I prescribed for her today I will follow up with her in 1 month if she is not 100% improved.

## 2016-04-10 ENCOUNTER — Encounter: Payer: Self-pay | Admitting: Podiatry

## 2016-04-10 ENCOUNTER — Ambulatory Visit (INDEPENDENT_AMBULATORY_CARE_PROVIDER_SITE_OTHER): Payer: Federal, State, Local not specified - PPO | Admitting: Podiatry

## 2016-04-10 DIAGNOSIS — M722 Plantar fascial fibromatosis: Secondary | ICD-10-CM

## 2016-04-11 NOTE — Progress Notes (Signed)
She presents today complaining of continued pain to the right foot. Left foot she states is doing much better. She has had 2 injections to the right heel.  Objective: Vital signs are stable she is alert and oriented 3. Pulses are palpable. She has pain on palpation medially in a typical right heel.  Assessment: Plantar fasciitis right heel consistent.  Plan: This will be her third injection today we injected with Kenalog and local anesthetic right heel. Also suggested she continue all conservative therapies we discussed the possible need for shockwave therapy as well as EPF. I will follow-up with her in 1 month to 6 weeks.

## 2016-10-09 ENCOUNTER — Ambulatory Visit (INDEPENDENT_AMBULATORY_CARE_PROVIDER_SITE_OTHER): Payer: Federal, State, Local not specified - PPO | Admitting: Podiatry

## 2016-10-09 DIAGNOSIS — M722 Plantar fascial fibromatosis: Secondary | ICD-10-CM | POA: Diagnosis not present

## 2016-10-09 DIAGNOSIS — M779 Enthesopathy, unspecified: Secondary | ICD-10-CM | POA: Diagnosis not present

## 2016-10-09 NOTE — Progress Notes (Signed)
Presents today with chief complaint pain to the dorsolateral aspect of the right foot 2 weeks. States that she was taking a prednisone Dosepak her right hand and and she will begin her foot flared up become extremely painful and tapered off toward the end of the pack of pills. She states that right now seems to be doing pretty good she continues to wear her night splint at night.  Objective: Vital signs are stable alert and oriented 3. Pulses are palpable. Neurologic sensorium is intact. Deep tendon reflexes are intact she has pain from a plain range of motion at the level of the fourth fifth metatarsocuboid articulation she has pain with palpation me for continued tubercle of the right heel to lesser degree.  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  Plan: Encouraged her to continue her anti-inflammatories and might be. Follow up with me should this worsen.

## 2016-10-16 ENCOUNTER — Ambulatory Visit: Payer: Federal, State, Local not specified - PPO | Admitting: Podiatry

## 2016-12-16 ENCOUNTER — Encounter: Payer: Self-pay | Admitting: Podiatry

## 2016-12-16 ENCOUNTER — Ambulatory Visit (INDEPENDENT_AMBULATORY_CARE_PROVIDER_SITE_OTHER): Payer: Federal, State, Local not specified - PPO | Admitting: Podiatry

## 2016-12-16 ENCOUNTER — Ambulatory Visit: Payer: Federal, State, Local not specified - PPO | Admitting: Podiatry

## 2016-12-16 DIAGNOSIS — M722 Plantar fascial fibromatosis: Secondary | ICD-10-CM

## 2016-12-16 NOTE — Progress Notes (Signed)
She presents today states that both of her feet are killing her. She states that should've taken the shot last time he offered to me.  Objective: Vital signs are stable she is alert and oriented 3. Both of my heels are hurting.  She has pain on palpation medial calcaneal tubercles bilateral. Pulses are palpable pain.  Assessment: Pain and limp secondary to plantar fasciitis bilateral.  Plan: I injected the bilateral heels today we'll consider a second pair of orthotics next visit.

## 2017-01-07 DEATH — deceased

## 2017-01-08 ENCOUNTER — Ambulatory Visit (INDEPENDENT_AMBULATORY_CARE_PROVIDER_SITE_OTHER): Payer: Federal, State, Local not specified - PPO | Admitting: Podiatry

## 2017-01-08 ENCOUNTER — Encounter: Payer: Self-pay | Admitting: Podiatry

## 2017-01-08 DIAGNOSIS — M722 Plantar fascial fibromatosis: Secondary | ICD-10-CM

## 2017-01-08 NOTE — Progress Notes (Signed)
She presents today for a follow-up of her plantar fasciitis. She states that the left foot is 100% improved the right foot is 90% improved. She states that she is taking ibuprofen and Tylenol together during the day which really makes the right foot film much better. She states it took a couple of days but is doing a lot better. She can actually work with no problems.  Objective: Vital signs are stable she is alert and oriented 3. Pulses are palpable. She has no pain on palpation medially. Tubercle of the left heel like tenderness on palpation immediately visible of the right heel feels much better than it did previously. There is no heat and is no longer indurated.  Assessment: 90% reduction in pain to the right foot 100% reduction in pain to the left foot plantar fasciitis.  Plan: Follow up with me on an as-needed basis.

## 2018-07-09 ENCOUNTER — Other Ambulatory Visit: Payer: Self-pay | Admitting: Unknown Physician Specialty

## 2018-07-09 DIAGNOSIS — M25361 Other instability, right knee: Secondary | ICD-10-CM

## 2018-07-09 DIAGNOSIS — M25561 Pain in right knee: Secondary | ICD-10-CM

## 2018-07-09 DIAGNOSIS — G8929 Other chronic pain: Secondary | ICD-10-CM

## 2018-07-09 DIAGNOSIS — Z96651 Presence of right artificial knee joint: Secondary | ICD-10-CM

## 2018-07-28 ENCOUNTER — Ambulatory Visit: Payer: Federal, State, Local not specified - PPO

## 2018-08-03 ENCOUNTER — Ambulatory Visit
Admission: RE | Admit: 2018-08-03 | Discharge: 2018-08-03 | Disposition: A | Discharge status: Home / Self Care | Payer: Federal, State, Local not specified - PPO | Source: Ambulatory Visit | Attending: Unknown Physician Specialty | Admitting: Unknown Physician Specialty

## 2018-08-03 DIAGNOSIS — Z96651 Presence of right artificial knee joint: Secondary | ICD-10-CM

## 2018-08-03 DIAGNOSIS — G8929 Other chronic pain: Secondary | ICD-10-CM | POA: Diagnosis not present

## 2018-08-03 DIAGNOSIS — M25361 Other instability, right knee: Secondary | ICD-10-CM

## 2018-08-03 DIAGNOSIS — M25561 Pain in right knee: Secondary | ICD-10-CM | POA: Insufficient documentation

## 2019-12-06 ENCOUNTER — Ambulatory Visit: Payer: Federal, State, Local not specified - PPO | Admitting: Podiatry

## 2020-11-04 IMAGING — MR MR KNEE*R* W/O CM
6 series · 40 of 40 positions shown · non-contrast
Comparison: MRI right knee 07/08/2011. Pre arthroplasty CT scan
planning study 07/31/2011.

CLINICAL DATA: Lateral right knee pain and instability. History of
medial compartment arthroplasty approximately 3 years ago.

EXAM:
MRI OF THE RIGHT KNEE WITHOUT CONTRAST
TECHNIQUE: Multiplanar, multisequence MR imaging of the knee was performed. No
intravenous contrast was administered.

[Series 3: T1 · axial · right · 3.0mm · 0.44mm/px · z∈[-56,+49]mm · 7 of 33 slices shown (1 of 2)]
[im 1/33]
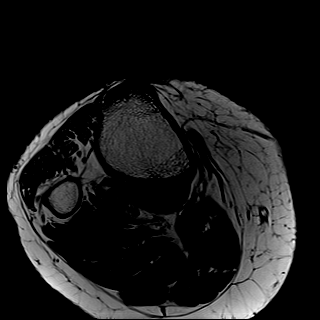
[im 6/33]
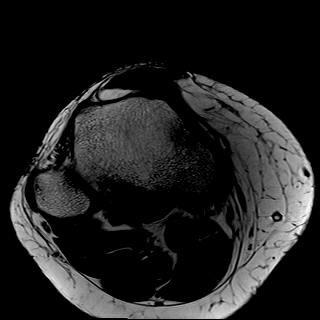
[im 11/33]
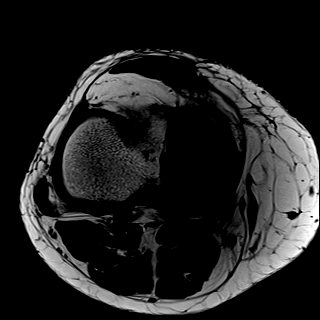
[im 17/33]
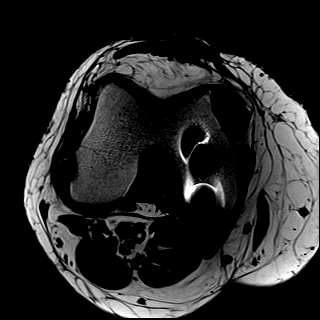
[im 22/33]
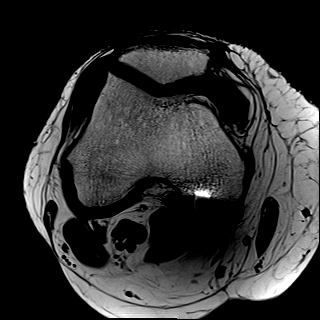
[im 27/33]
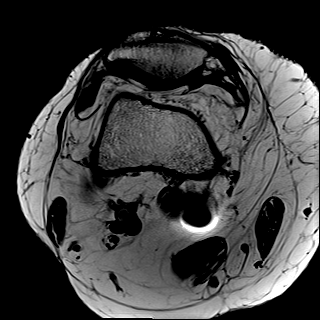
[im 33/33]
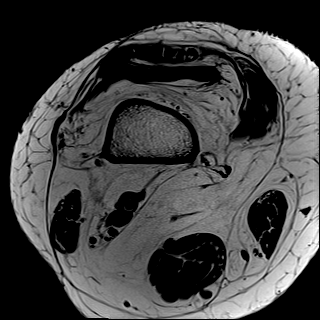

[Series 4: STIR · axial · right · 4.0mm · 0.53mm/px · z∈[-67,+61]mm · 7 of 33 slices shown (1 of 2)]
[im 1/33]
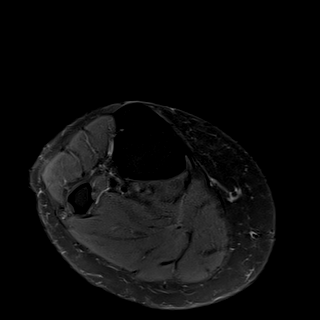
[im 6/33]
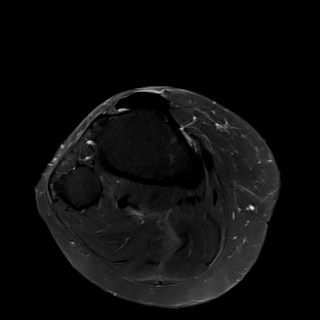
[im 11/33]
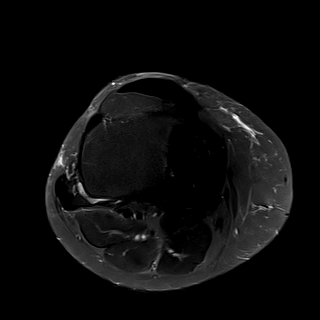
[im 17/33]
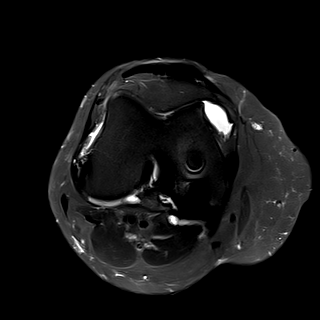
[im 22/33]
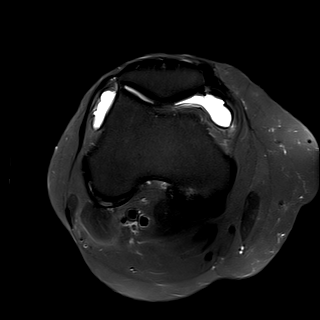
[im 27/33]
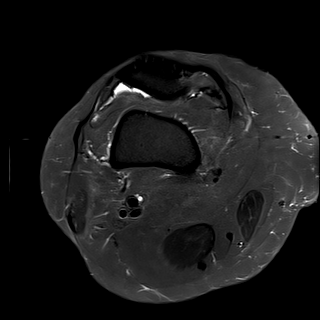
[im 33/33]
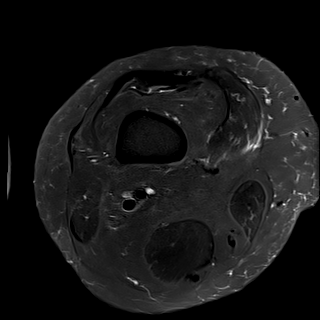

[Series 5: STIR · coronal · right · 3.0mm · 0.55mm/px · 6 of 30 slices shown (2 of 2)]
[im 1/30]
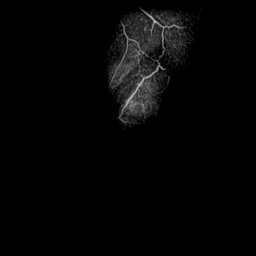
[im 6/30]
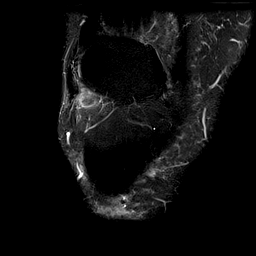
[im 12/30]
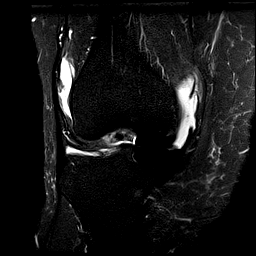
[im 18/30]
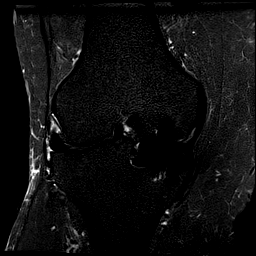
[im 24/30]
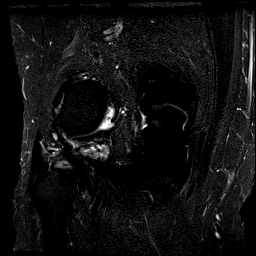
[im 30/30]
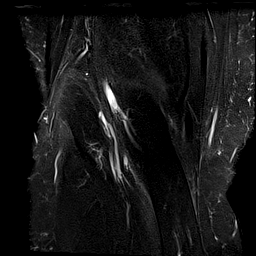

[Series 6: T1 · coronal · right · 4.0mm · 0.59mm/px · 6 of 26 slices shown (2 of 2)]
[im 1/26]
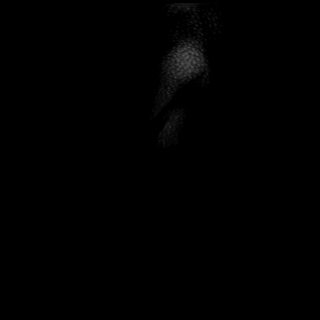
[im 6/26]
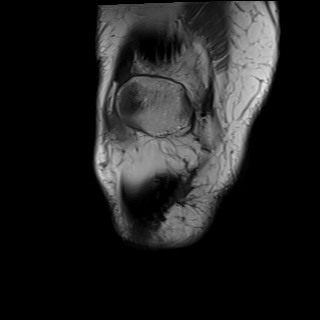
[im 11/26]
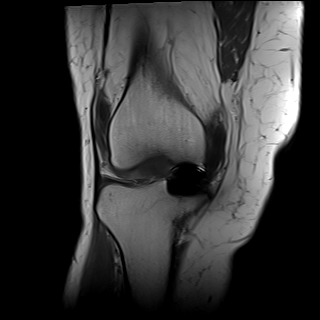
[im 16/26]
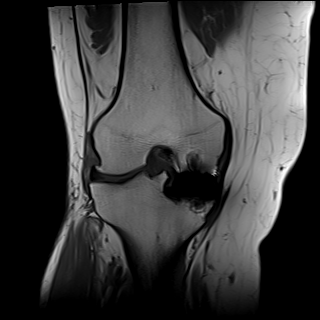
[im 21/26]
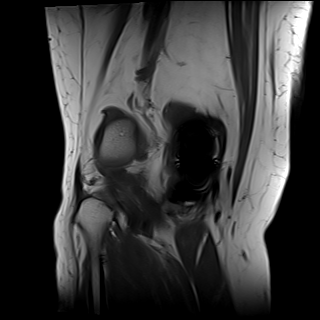
[im 26/26]
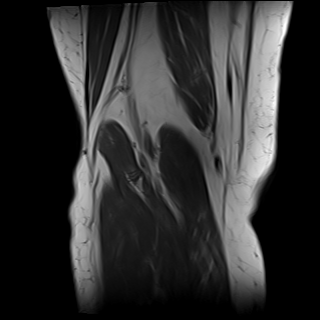

[Series 7: T2 · sagittal · right · 3.0mm · 0.44mm/px · 7 of 33 slices shown (1 of 2)]
[im 1/33]
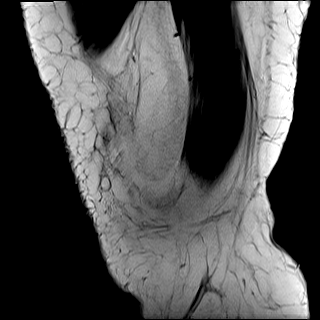
[im 6/33]
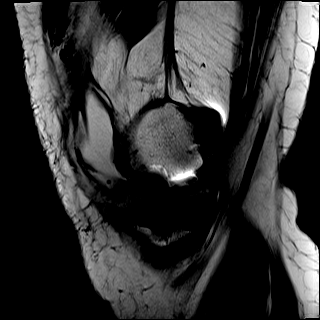
[im 11/33]
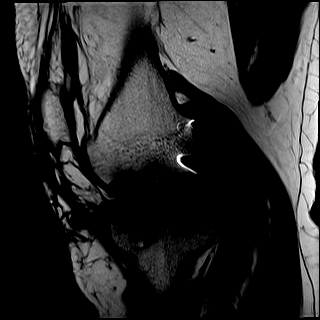
[im 17/33]
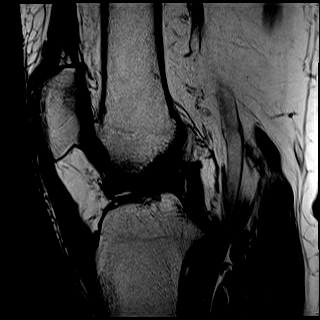
[im 22/33]
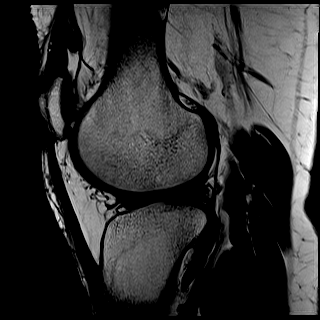
[im 27/33]
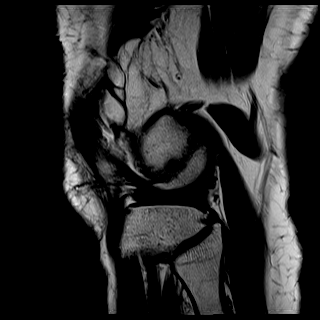
[im 33/33]
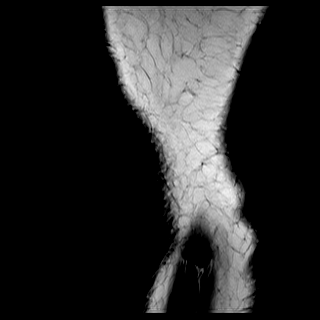

[Series 8: T2 · axial · right · 4.0mm · 0.53mm/px · z∈[-67,+61]mm · 7 of 33 slices shown (2 of 2)]
[im 1/33]
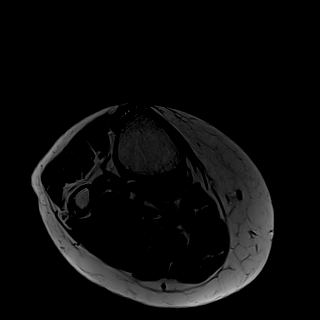
[im 6/33]
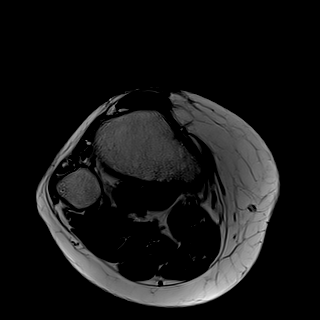
[im 11/33]
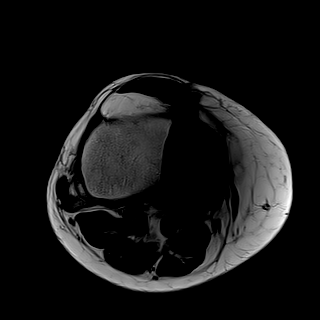
[im 17/33]
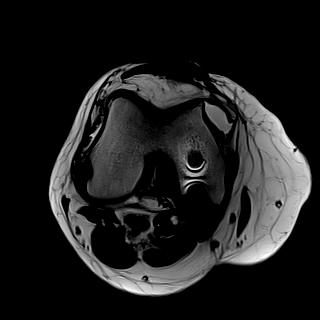
[im 22/33]
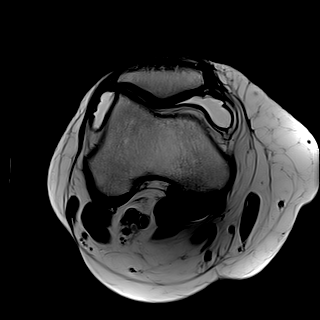
[im 27/33]
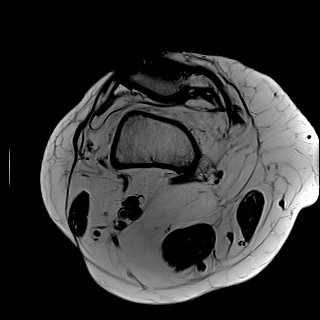
[im 33/33]
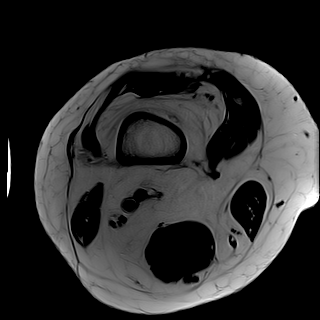

[40 of 40 positions shown; findings below may reference images not displayed]

FINDINGS: MENISCI

Medial meniscus:  Status post arthroplasty.

Lateral meniscus:  Intact.

LIGAMENTS

Cruciates:  Intact.

Collaterals:  Intact.

CARTILAGE

Patellofemoral:  Intact.

Medial:  Status post arthroplasty.

Lateral:  Preserved.

Joint:  Small joint effusion.

Popliteal Fossa:  No Baker's cyst.

Extensor Mechanism: Intact. There is some thickening of the lateral
aspect of the patellar tendon which may be related to the patient's
surgery with overlying scar noted.

Bones: Normal marrow signal throughout. Small osteophytes about the
lateral compartment noted.

Other: None.
IMPRESSION: Status post medial compartment arthroplasty without evidence of
complication.

Negative for ligament or lateral meniscal tear.

Mild thickening of the lateral aspect of the patellar tendon is
likely related to postoperative change. The extensor mechanism of
the knee is intact.

## 2020-11-07 ENCOUNTER — Other Ambulatory Visit: Payer: Self-pay

## 2020-11-07 ENCOUNTER — Encounter
Admission: RE | Admit: 2020-11-07 | Discharge: 2020-11-07 | Disposition: A | Discharge status: Home / Self Care | Payer: Federal, State, Local not specified - PPO | Source: Ambulatory Visit | Attending: Specialist | Admitting: Specialist

## 2020-11-07 NOTE — Patient Instructions (Signed)
Your procedure is scheduled on: 11/16/20 Report to DAY SURGERY DEPARTMENT LOCATED ON 2ND FLOOR MEDICAL MALL ENTRANCE . To find out your arrival time please call [phone] between 1PM - 3PM on 11/15/20.  Remember: Instructions that are not followed completely may result in serious medical risk, up to and including death, or upon the discretion of your surgeon and anesthesiologist your surgery may need to be rescheduled.     _X__ 1. Do not eat food after midnight the night before your procedure.                 No gum chewing or hard candies. You may drink clear liquids up to 2 hours                 before you are scheduled to arrive for your surgery- DO not drink clear                 liquids within 2 hours of the start of your surgery.                 Clear Liquids include:  water, apple juice without pulp, clear carbohydrate                 drink such as Clearfast or Gatorade, Black Coffee or Tea (Do not add                 anything to coffee or tea). Diabetics water only  __X__2.  On the morning of surgery brush your teeth with toothpaste and water, you                 may rinse your mouth with mouthwash if you wish.  Do not swallow any              toothpaste of mouthwash.     _X__ 3.  No Alcohol for 24 hours before or after surgery.   _X__ 4.  Do Not Smoke or use e-cigarettes For 24 Hours Prior to Your Surgery.                 Do not use any chewable tobacco products for at least 6 hours prior to                 surgery.  ____  5.  Bring all medications with you on the day of surgery if instructed.   __X__  6.  Notify your doctor if there is any change in your medical condition      (cold, fever, infections).     Do not wear jewelry, make-up, hairpins, clips or nail polish. Do not wear lotions, powders, or perfumes.  Do not shave 48 hours prior to surgery. Men may shave face and neck. Do not bring valuables to the hospital.    [facility] is not responsible for any belongings or  valuables.  Contacts, dentures/partials or body piercings may not be worn into surgery. Bring a case for your contacts, glasses or hearing aids, a denture cup will be supplied. Leave your suitcase in the car. After surgery it may be brought to your room. For patients admitted to the hospital, discharge time is determined by your treatment team.   Patients discharged the day of surgery will not be allowed to drive home.   Please read over the following fact sheets that you were given:   CHG soap  __X__ Take these medicines the morning of surgery with A SIP OF WATER:  1. pantoprazole (PROTONIX) 20 MG tablet  2. Loratadine and flonase if needed  3.   4.  5.  6.  ____ Fleet Enema (as directed)   __X__ Use CHG Soap/SAGE wipes as directed  ____ Use inhalers on the day of surgery  ____ Stop metformin/Janumet/Farxiga 2 days prior to surgery    ____ Take 1/2 of usual insulin dose the night before surgery. No insulin the morning          of surgery.   ____ Stop Blood Thinners Coumadin/Plavix/Xarelto/Pleta/Pradaxa/Eliquis/Effient/Aspirin  on   Or contact your Surgeon, Cardiologist or Medical Doctor regarding  ability to stop your blood thinners  __X__ Stop Anti-inflammatories 7 days before surgery such as Advil, Ibuprofen, Motrin,  BC or Goodies Powder, Naprosyn, Naproxen, Aleve, Aspirin    __X__ Stop all herbal supplements, fish oil or vitamin E until after surgery.    ____ Bring C-Pap to the hospital.

## 2020-11-07 NOTE — Pre-Procedure Instructions (Signed)
Spoke to [employer] from [place], she is working on getting copies of labs, ekg and clearance note from Dr [employer] office (PCP) [facility](??) [phone]. Pt was seen on 11/03/20.

## 2020-11-08 ENCOUNTER — Other Ambulatory Visit: Payer: Self-pay | Admitting: Specialist

## 2020-11-08 NOTE — H&P (Signed)
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 Symptoms are rated as moderate to severe, and have been worsening.  This is significantly impairing activities of daily living.  She has elected for surgical management.   Past Medical History:  Diagnosis Date  . GERD (gastroesophageal reflux disease)   . Hypertension   . PONV (postoperative nausea and vomiting)    not with most recent surgeries   Past Surgical History:  Procedure Laterality Date  . ABDOMINAL HYSTERECTOMY    . CARPAL TUNNEL RELEASE    . NASAL SINUS SURGERY    . NERVE REPAIR Right 02/13/2015   Procedure: NERVE REPAIR;  Surgeon: Deeann Saint, MD;  Location: [facility] ORS;  Service: Orthopedics;  Laterality: Right;  radial nerve  . PARTIAL KNEE ARTHROPLASTY    . THUMB ARTHROSCOPY     Social History   Socioeconomic History  . Marital status: Married    Spouse name: Not on file  . Number of children: Not on file  . Years of education: Not on file  . Highest education level: Not on file  Occupational History  . Not on file  Tobacco Use  . Smoking status: Never Smoker  . Smokeless tobacco: Never Used  Vaping Use  . Vaping Use: Never used  Substance and Sexual Activity  . Alcohol use: No    Alcohol/week: 0.0 standard drinks  . Drug use: No  . Sexual activity: Not on file  Other Topics Concern  . Not on file  Social History Narrative  . Not on file   Social Determinants of Health   Financial Resource Strain: Not on file  Food Insecurity: Not on file  Transportation Needs: Not on file  Physical Activity: Not on file  Stress: Not on file  Social Connections: Not on file   No family history on file. Allergies  Allergen Reactions  . Penicillins Swelling  . Sulfa Antibiotics  Swelling   Prior to Admission medications   Medication Sig Start Date End Date Taking? Authorizing Provider  atenolol-chlorthalidone (TENORETIC) 50-25 MG per tablet Take 1 tablet by mouth every morning.   Yes [provider]  benzonatate (TESSALON) 100 MG capsule Take 100 mg by mouth 3 (three) times daily as needed for cough. 05/28/20  Yes [provider]  fluticasone (FLONASE) 50 MCG/ACT nasal spray Place 1 spray into both nostrils daily as needed for allergies or rhinitis.   Yes [provider]  loratadine (CLARITIN) 10 MG tablet Take 10 mg by mouth daily as needed for allergies.   Yes [provider]  meloxicam (MOBIC) 15 MG tablet Take 1 tablet (15 mg total) by mouth daily. 12/25/15  Yes Hyatt, Max T, DPM  pantoprazole (PROTONIX) 20 MG tablet Take 20 mg by mouth 2 (two) times daily before a meal.   Yes [provider]  potassium chloride SA (KLOR-CON) 20 MEQ tablet Take 10 mEq by mouth daily.   Yes [provider]     Positive ROS: All other systems have been reviewed and were otherwise negative with the exception of those mentioned in the HPI and as above.  Physical Exam: General: Alert, no acute distress Cardiovascular: No pedal edema. Heart is regular and without murmur.  Respiratory: No cyanosis, no use of accessory musculature. Lungs are clear.  GI: No organomegaly, abdomen is soft and non-tender Skin: No lesions in the area of chief complaint Neurologic: Sensation intact distally Psychiatric: Patient is competent for consent with normal mood and affect Lymphatic: No axillary or cervical lymphadenopathy  MUSCULOSKELETAL: The right thumb is tender at the [facility] joint.  There is enlargement of the joint with spurs.  Is a positive grind test.  Her pinch is weakened.  Neurovascular status is otherwise.  Assessment: M18.11 Unil primary osteoarth of first carpometacarp joint, r hand  Plan: Plan for Procedure(s): rt  ARTHROPLASTY,  INTERPOSITION, INTERCARPAL OR CARPOMETACARPAL JOINTS  The risks benefits and alternatives were discussed with the patient including but not limited to the risks of nonoperative treatment, versus surgical intervention including infection, bleeding, nerve injury,  blood clots, cardiopulmonary complications, morbidity, mortality, among others, and they were willing to proceed.   Valinda Hoar, MD [phone]   11/08/2020 12:37 PM

## 2020-11-09 NOTE — Progress Notes (Signed)
Call to [facility], spoke with the receptionist which stated that the provider has not completed the requested clearance yet and will fax it along with labs and EKG once it is completed.

## 2020-11-14 ENCOUNTER — Other Ambulatory Visit: Payer: Self-pay

## 2020-11-14 ENCOUNTER — Other Ambulatory Visit
Admission: RE | Admit: 2020-11-14 | Discharge: 2020-11-14 | Disposition: A | Discharge status: Home / Self Care | Payer: Federal, State, Local not specified - PPO | Source: Ambulatory Visit | Attending: Specialist | Admitting: Specialist

## 2020-11-14 DIAGNOSIS — Z01812 Encounter for preprocedural laboratory examination: Secondary | ICD-10-CM | POA: Insufficient documentation

## 2020-11-14 DIAGNOSIS — Z791 Long term (current) use of non-steroidal anti-inflammatories (NSAID): Secondary | ICD-10-CM | POA: Diagnosis not present

## 2020-11-14 DIAGNOSIS — Z882 Allergy status to sulfonamides status: Secondary | ICD-10-CM | POA: Diagnosis not present

## 2020-11-14 DIAGNOSIS — Z880 Allergy status to penicillin: Secondary | ICD-10-CM | POA: Diagnosis not present

## 2020-11-14 DIAGNOSIS — M1811 Unilateral primary osteoarthritis of first carpometacarpal joint, right hand: Secondary | ICD-10-CM | POA: Diagnosis present

## 2020-11-14 DIAGNOSIS — Z79899 Other long term (current) drug therapy: Secondary | ICD-10-CM | POA: Diagnosis not present

## 2020-11-14 DIAGNOSIS — Z20822 Contact with and (suspected) exposure to covid-19: Secondary | ICD-10-CM | POA: Insufficient documentation

## 2020-11-14 NOTE — Pre-Procedure Instructions (Signed)
Called over to [place] at [facility] to find out about labs/ekg and clearance. Megan faxed over labs and ekg. Aundra Millet said that pt did not need clearance but their office is the one that wanted clearance. Pt is healthy from anesthesia standpoint but still [facility] has requested clearance. Tried to call Mardella Layman at [facility] who has been working on clearance and was unable to leave a message. Left message with receptionist about this and she is forwarding this message to Dr Hyacinth Meeker to see if clearance is still needed

## 2020-11-15 LAB — SARS CORONAVIRUS 2 (TAT 6-24 HRS): SARS Coronavirus 2: NEGATIVE

## 2020-11-15 NOTE — Progress Notes (Signed)
Per Mardella Layman from [facility], Dr. Hyacinth Meeker is no longer requiring a medical clearance prior to upcoming surgery tomorrow.

## 2020-11-16 ENCOUNTER — Encounter: Payer: Self-pay | Admitting: Specialist

## 2020-11-16 ENCOUNTER — Ambulatory Visit: Payer: Federal, State, Local not specified - PPO

## 2020-11-16 ENCOUNTER — Other Ambulatory Visit: Payer: Self-pay

## 2020-11-16 ENCOUNTER — Ambulatory Visit
Admission: RE | Admit: 2020-11-16 | Discharge: 2020-11-16 | Disposition: A | Discharge status: Home / Self Care | Payer: Federal, State, Local not specified - PPO | Attending: Specialist | Admitting: Specialist

## 2020-11-16 ENCOUNTER — Ambulatory Visit: Payer: Federal, State, Local not specified - PPO | Admitting: Urgent Care

## 2020-11-16 ENCOUNTER — Encounter
Admission: RE | Disposition: A | Discharge status: Home / Self Care | Payer: Self-pay | Source: Home / Self Care | Attending: Specialist

## 2020-11-16 DIAGNOSIS — Z791 Long term (current) use of non-steroidal anti-inflammatories (NSAID): Secondary | ICD-10-CM | POA: Insufficient documentation

## 2020-11-16 DIAGNOSIS — Z882 Allergy status to sulfonamides status: Secondary | ICD-10-CM | POA: Insufficient documentation

## 2020-11-16 DIAGNOSIS — Z79899 Other long term (current) drug therapy: Secondary | ICD-10-CM | POA: Insufficient documentation

## 2020-11-16 DIAGNOSIS — Z20822 Contact with and (suspected) exposure to covid-19: Secondary | ICD-10-CM | POA: Insufficient documentation

## 2020-11-16 DIAGNOSIS — Z880 Allergy status to penicillin: Secondary | ICD-10-CM | POA: Insufficient documentation

## 2020-11-16 DIAGNOSIS — M1811 Unilateral primary osteoarthritis of first carpometacarpal joint, right hand: Secondary | ICD-10-CM | POA: Diagnosis not present

## 2020-11-16 HISTORY — PX: FINGER ARTHROSCOPY WITH CARPOMETACARPEL (CMC) ARTHROPLASTY: SHX5629

## 2020-11-16 SURGERY — FINGER ARTHROSCOPY WITH CARPOMETACARPEL (CMC) ARTHROPLASTY
Anesthesia: General | Site: Thumb | Laterality: Right

## 2020-11-16 MED ORDER — EPHEDRINE SULFATE 50 MG/ML IJ SOLN
INTRAMUSCULAR | Status: DC | PRN
  Administered 2020-11-16 (×2): 10 mg via INTRAVENOUS

## 2020-11-16 MED ORDER — DEXAMETHASONE SODIUM PHOSPHATE 10 MG/ML IJ SOLN
INTRAMUSCULAR | Status: DC | PRN
  Administered 2020-11-16: 10 mg via INTRAVENOUS

## 2020-11-16 MED ORDER — FENTANYL CITRATE (PF) 100 MCG/2ML IJ SOLN
INTRAMUSCULAR | Status: AC
  Filled 2020-11-16: qty 2

## 2020-11-16 MED ORDER — NEOMYCIN-POLYMYXIN B GU 40-200000 IR SOLN
Status: DC | PRN
  Administered 2020-11-16: 2 mL

## 2020-11-16 MED ORDER — CHLORHEXIDINE GLUCONATE 0.12 % MT SOLN
OROMUCOSAL | Status: AC
  Administered 2020-11-16: 15 mL via OROMUCOSAL
  Filled 2020-11-16: qty 15

## 2020-11-16 MED ORDER — CLINDAMYCIN PHOSPHATE 600 MG/50ML IV SOLN
600.0000 mg | INTRAVENOUS | Status: AC
  Administered 2020-11-16: 600 mg via INTRAVENOUS

## 2020-11-16 MED ORDER — BUPIVACAINE HCL (PF) 0.5 % IJ SOLN
INTRAMUSCULAR | Status: AC
  Filled 2020-11-16: qty 30

## 2020-11-16 MED ORDER — CLINDAMYCIN PHOSPHATE 600 MG/50ML IV SOLN
INTRAVENOUS | Status: AC
  Filled 2020-11-16: qty 50

## 2020-11-16 MED ORDER — FENTANYL CITRATE (PF) 100 MCG/2ML IJ SOLN: 25.0000 ug | INTRAMUSCULAR | Status: DC | PRN

## 2020-11-16 MED ORDER — PROPOFOL 10 MG/ML IV BOLUS
INTRAVENOUS | Status: DC | PRN
  Administered 2020-11-16: 150 mg via INTRAVENOUS

## 2020-11-16 MED ORDER — GABAPENTIN 400 MG PO CAPS: 400.0000 mg | ORAL_CAPSULE | Freq: Three times a day (TID) | ORAL | 3 refills | Status: AC

## 2020-11-16 MED ORDER — OXYCODONE HCL 5 MG PO TABS: 5.0000 mg | ORAL_TABLET | Freq: Once | ORAL | Status: DC | PRN

## 2020-11-16 MED ORDER — CEFAZOLIN SODIUM-DEXTROSE 2-3 GM-%(50ML) IV SOLR
INTRAVENOUS | Status: DC | PRN
  Administered 2020-11-16: 2 g via INTRAVENOUS

## 2020-11-16 MED ORDER — PHENYLEPHRINE HCL (PRESSORS) 10 MG/ML IV SOLN
INTRAVENOUS | Status: DC | PRN
  Administered 2020-11-16 (×2): 100 ug via INTRAVENOUS

## 2020-11-16 MED ORDER — ACETAMINOPHEN 10 MG/ML IV SOLN
INTRAVENOUS | Status: DC | PRN
  Administered 2020-11-16: 1000 mg via INTRAVENOUS

## 2020-11-16 MED ORDER — GABAPENTIN 300 MG PO CAPS
ORAL_CAPSULE | ORAL | Status: AC
  Administered 2020-11-16: 300 mg via ORAL
  Filled 2020-11-16: qty 1

## 2020-11-16 MED ORDER — ONDANSETRON HCL 4 MG/2ML IJ SOLN
INTRAMUSCULAR | Status: DC | PRN
  Administered 2020-11-16 (×2): 4 mg via INTRAVENOUS

## 2020-11-16 MED ORDER — CHLORHEXIDINE GLUCONATE 0.12 % MT SOLN: 15.0000 mL | Freq: Once | OROMUCOSAL | Status: AC

## 2020-11-16 MED ORDER — MELOXICAM 7.5 MG PO TABS
ORAL_TABLET | ORAL | Status: AC
  Administered 2020-11-16: 15 mg via ORAL
  Filled 2020-11-16: qty 2

## 2020-11-16 MED ORDER — ATENOLOL 50 MG PO TABS
50.0000 mg | ORAL_TABLET | Freq: Once | ORAL | Status: AC
  Administered 2020-11-16: 50 mg via ORAL
  Filled 2020-11-16: qty 1

## 2020-11-16 MED ORDER — LACTATED RINGERS IV SOLN: INTRAVENOUS | Status: DC

## 2020-11-16 MED ORDER — CEFAZOLIN SODIUM-DEXTROSE 2-4 GM/100ML-% IV SOLN: 2.0000 g | INTRAVENOUS | Status: DC

## 2020-11-16 MED ORDER — HYDROCODONE-ACETAMINOPHEN 5-325 MG PO TABS
ORAL_TABLET | ORAL | Status: AC
  Administered 2020-11-16: 1 via ORAL
  Filled 2020-11-16: qty 1

## 2020-11-16 MED ORDER — HYDROCODONE-ACETAMINOPHEN 5-325 MG PO TABS: 1.0000 | ORAL_TABLET | Freq: Once | ORAL | Status: AC

## 2020-11-16 MED ORDER — MIDAZOLAM HCL 2 MG/2ML IJ SOLN
INTRAMUSCULAR | Status: AC
  Filled 2020-11-16: qty 2

## 2020-11-16 MED ORDER — OXYCODONE HCL 5 MG/5ML PO SOLN: 5.0000 mg | Freq: Once | ORAL | Status: DC | PRN

## 2020-11-16 MED ORDER — HYDROCODONE-ACETAMINOPHEN 5-325 MG PO TABS: 1.0000 | ORAL_TABLET | Freq: Four times a day (QID) | ORAL | 0 refills | Status: AC | PRN

## 2020-11-16 MED ORDER — ACETAMINOPHEN 10 MG/ML IV SOLN: 1000.0000 mg | Freq: Once | INTRAVENOUS | Status: DC | PRN

## 2020-11-16 MED ORDER — BUPIVACAINE HCL (PF) 0.5 % IJ SOLN
INTRAMUSCULAR | Status: DC | PRN
  Administered 2020-11-16: 30 mL

## 2020-11-16 MED ORDER — MELOXICAM 7.5 MG PO TABS: 15.0000 mg | ORAL_TABLET | ORAL | Status: AC

## 2020-11-16 MED ORDER — ACETAMINOPHEN 10 MG/ML IV SOLN
INTRAVENOUS | Status: AC
  Filled 2020-11-16: qty 100

## 2020-11-16 MED ORDER — CEFAZOLIN SODIUM-DEXTROSE 2-4 GM/100ML-% IV SOLN
INTRAVENOUS | Status: AC
  Filled 2020-11-16: qty 100

## 2020-11-16 MED ORDER — NEOMYCIN-POLYMYXIN B GU 40-200000 IR SOLN
Status: AC
  Filled 2020-11-16: qty 2

## 2020-11-16 MED ORDER — SODIUM CHLORIDE 0.9 % IV SOLN
INTRAVENOUS | Status: DC | PRN
  Administered 2020-11-16: 30 ug/min via INTRAVENOUS

## 2020-11-16 MED ORDER — CHLORHEXIDINE GLUCONATE CLOTH 2 % EX PADS: 6.0000 | MEDICATED_PAD | Freq: Once | CUTANEOUS | Status: DC

## 2020-11-16 MED ORDER — MIDAZOLAM HCL 2 MG/2ML IJ SOLN
INTRAMUSCULAR | Status: DC | PRN
  Administered 2020-11-16: 2 mg via INTRAVENOUS

## 2020-11-16 MED ORDER — GABAPENTIN 300 MG PO CAPS: 300.0000 mg | ORAL_CAPSULE | ORAL | Status: AC

## 2020-11-16 MED ORDER — FENTANYL CITRATE (PF) 100 MCG/2ML IJ SOLN
INTRAMUSCULAR | Status: DC | PRN
  Administered 2020-11-16: 50 ug via INTRAVENOUS
  Administered 2020-11-16: 25 ug via INTRAVENOUS
  Administered 2020-11-16: 50 ug via INTRAVENOUS
  Administered 2020-11-16: 25 ug via INTRAVENOUS

## 2020-11-16 MED ORDER — LIDOCAINE HCL (CARDIAC) PF 100 MG/5ML IV SOSY
PREFILLED_SYRINGE | INTRAVENOUS | Status: DC | PRN
  Administered 2020-11-16: 100 mg via INTRAVENOUS

## 2020-11-16 MED ORDER — ONDANSETRON HCL 4 MG/2ML IJ SOLN: 4.0000 mg | Freq: Once | INTRAMUSCULAR | Status: DC | PRN

## 2020-11-16 MED ORDER — ORAL CARE MOUTH RINSE: 15.0000 mL | Freq: Once | OROMUCOSAL | Status: AC

## 2020-11-16 MED ORDER — PROPOFOL 10 MG/ML IV BOLUS
INTRAVENOUS | Status: AC
  Filled 2020-11-16: qty 20

## 2020-11-16 SURGICAL SUPPLY — 48 items
APL PRP STRL LF DISP 70% ISPRP (MISCELLANEOUS) ×1
BLADE OSC/SAGITTAL MD 5.5X18 (BLADE) ×2 IMPLANT
BLADE SURG MINI STRL (BLADE) ×2 IMPLANT
BNDG ESMARK 4X12 TAN STRL LF (GAUZE/BANDAGES/DRESSINGS) ×2 IMPLANT
CHLORAPREP W/TINT 26 (MISCELLANEOUS) ×2 IMPLANT
COVER WAND RF STERILE (DRAPES) ×2 IMPLANT
CUFF TOURN SGL QUICK 18X4 (TOURNIQUET CUFF) ×2 IMPLANT
DECANTER SPIKE VIAL GLASS SM (MISCELLANEOUS) ×2 IMPLANT
DRAPE FLUOR MINI C-ARM 54X84 (DRAPES) ×2 IMPLANT
DRSG GAUZE FLUFF 36X18 (GAUZE/BANDAGES/DRESSINGS) ×4 IMPLANT
ELECT REM PT RETURN 9FT ADLT (ELECTROSURGICAL) ×2
ELECTRODE REM PT RTRN 9FT ADLT (ELECTROSURGICAL) ×1 IMPLANT
GAUZE XEROFORM 1X8 LF (GAUZE/BANDAGES/DRESSINGS) ×2 IMPLANT
GLOVE SURG ENC MOIS LTX SZ7.5 (GLOVE) ×2 IMPLANT
GLOVE SURG ENC MOIS LTX SZ8 (GLOVE) IMPLANT
GOWN STRL REUS W/ TWL LRG LVL3 (GOWN DISPOSABLE) ×1 IMPLANT
GOWN STRL REUS W/TWL LRG LVL3 (GOWN DISPOSABLE) ×2
GOWN STRL REUS W/TWL LRG LVL4 (GOWN DISPOSABLE) ×2 IMPLANT
KIT TURNOVER KIT A (KITS) ×2 IMPLANT
LOOP VESSEL MINI 0.8X406 BLUE (MISCELLANEOUS) ×1 IMPLANT
LOOPS BLUE MINI 0.8X406MM (MISCELLANEOUS) ×1
MANIFOLD NEPTUNE II (INSTRUMENTS) ×2 IMPLANT
NDL FILTER BLUNT 18X1 1/2 (NEEDLE) ×1 IMPLANT
NEEDLE FILTER BLUNT 18X 1/2SAF (NEEDLE) ×1
NEEDLE FILTER BLUNT 18X1 1/2 (NEEDLE) ×1 IMPLANT
NS IRRIG 500ML POUR BTL (IV SOLUTION) ×2 IMPLANT
PACK EXTREMITY ARMC (MISCELLANEOUS) ×2 IMPLANT
PAD CAST CTTN 4X4 STRL (SOFTGOODS) ×1 IMPLANT
PADDING CAST COTTON 4X4 STRL (SOFTGOODS) ×2
PASSER SUT SWANSON 36MM LOOP (INSTRUMENTS) ×2 IMPLANT
SPLINT CAST 1 STEP 3X12 (MISCELLANEOUS) ×2 IMPLANT
SPONGE LAP 18X18 RF (DISPOSABLE) ×2 IMPLANT
STOCKINETTE 48X4 2 PLY STRL (GAUZE/BANDAGES/DRESSINGS) ×1 IMPLANT
STOCKINETTE BIAS CUT 4 980044 (GAUZE/BANDAGES/DRESSINGS) ×2 IMPLANT
STOCKINETTE STRL 4IN 9604848 (GAUZE/BANDAGES/DRESSINGS) ×2 IMPLANT
SUT ETHILON 4-0 (SUTURE)
SUT ETHILON 4-0 FS2 18XMFL BLK (SUTURE)
SUT ETHILON 5-0 FS-2 18 BLK (SUTURE) ×2 IMPLANT
SUT VIC AB 2-0 SH 27 (SUTURE) ×2
SUT VIC AB 2-0 SH 27XBRD (SUTURE) ×1 IMPLANT
SUT VIC AB 3-0 SH 27 (SUTURE) ×2
SUT VIC AB 3-0 SH 27X BRD (SUTURE) ×1 IMPLANT
SUT VIC AB 4-0 SH 27 (SUTURE) ×2
SUT VIC AB 4-0 SH 27XANBCTRL (SUTURE) ×1 IMPLANT
SUTURE ETHLN 4-0 FS2 18XMF BLK (SUTURE) ×1 IMPLANT
SYR 30ML LL (SYRINGE) ×2 IMPLANT
SYR 5ML LL (SYRINGE) ×2 IMPLANT
WIRE Z .062 C-WIRE SPADE TIP (WIRE) ×2 IMPLANT

## 2020-11-16 NOTE — Anesthesia Preprocedure Evaluation (Signed)
Anesthesia Evaluation  Patient identified by MRN, date of birth, ID band Patient awake    Reviewed: Allergy & Precautions, NPO status , Patient's Chart, lab work & pertinent test results  History of Anesthesia Complications (+) PONV and history of anesthetic complications  Airway Mallampati: II  TM Distance: >3 FB Neck ROM: Full    Dental  (+) Partial Lower   Pulmonary neg pulmonary ROS, neg sleep apnea, neg COPD, Patient abstained from smoking.Not current smoker,    Pulmonary exam normal breath sounds clear to auscultation       Cardiovascular Exercise Tolerance: Good METShypertension, Pt. on medications and Pt. on home beta blockers (-) CAD and (-) Past MI (-) dysrhythmias  Rhythm:Regular Rate:Normal - Systolic murmurs    Neuro/Psych negative neurological ROS  negative psych ROS   GI/Hepatic GERD (improved, no meds x 2 yrs)  Medicated and Controlled,(+)     (-) substance abuse  ,   Endo/Other  neg diabetes  Renal/GU negative Renal ROS     Musculoskeletal   Abdominal   Peds  Hematology   Anesthesia Other Findings Past Medical History: No date: GERD (gastroesophageal reflux disease) No date: Hypertension No date: PONV (postoperative nausea and vomiting)     Comment:  not with most recent surgeries  Reproductive/Obstetrics                             Anesthesia Physical  Anesthesia Plan  ASA: II  Anesthesia Plan: General   Post-op Pain Management:    Induction: Intravenous  PONV Risk Score and Plan: 4 or greater and Ondansetron, Dexamethasone and Midazolam  Airway Management Planned: LMA  Additional Equipment: None  Intra-op Plan:   Post-operative Plan: Extubation in OR  Informed Consent: I have reviewed the patients History and Physical, chart, labs and discussed the procedure including the risks, benefits and alternatives for the proposed anesthesia with the patient  or authorized representative who has indicated his/her understanding and acceptance.     Dental advisory given  Plan Discussed with: CRNA and Surgeon  Anesthesia Plan Comments: (Discussed risks of anesthesia with patient, including PONV, sore throat, lip/dental damage. Rare risks discussed as well, such as cardiorespiratory and neurological sequelae. Patient understands.)        Anesthesia Quick Evaluation

## 2020-11-16 NOTE — Anesthesia Procedure Notes (Signed)
Procedure Name: LMA Insertion Date/Time: 11/16/2020 9:25 AM Performed by: Junious Silk, CRNA Pre-anesthesia Checklist: Patient identified, Patient being monitored, Timeout performed, Emergency Drugs available and Suction available Patient Re-evaluated:Patient Re-evaluated prior to induction Oxygen Delivery Method: Circle system utilized Preoxygenation: Pre-oxygenation with 100% oxygen Induction Type: IV induction Ventilation: Mask ventilation without difficulty LMA: LMA inserted LMA Size: 4.0 Tube type: Oral Number of attempts: 1 Placement Confirmation: positive ETCO2 and breath sounds checked- equal and bilateral Tube secured with: Tape Dental Injury: Teeth and Oropharynx as per pre-operative assessment

## 2020-11-16 NOTE — Anesthesia Postprocedure Evaluation (Signed)
Anesthesia Post Note  Patient: MILKA WINDHOLZ  Procedure(s) Performed: rt  ARTHROPLASTY, INTERPOSITION, INTERCARPAL CARPOMETACARPAL JOINTS (Right Thumb)  Patient location during evaluation: PACU Anesthesia Type: General Level of consciousness: awake and alert Pain management: pain level controlled Vital Signs Assessment: post-procedure vital signs reviewed and stable Respiratory status: spontaneous breathing, nonlabored ventilation and respiratory function stable Cardiovascular status: blood pressure returned to baseline and stable Postop Assessment: no apparent nausea or vomiting Anesthetic complications: no   No complications documented.   Last Vitals:  Vitals:   11/16/20 1226 11/16/20 1257  BP: 131/73 (!) 143/83  Pulse: 76 65  Resp: 16 16  Temp: (!) 36.1 C (!) 36.1 C  SpO2: 100% 98%    Last Pain:  Vitals:   11/16/20 1257  TempSrc: Temporal  PainSc: 3                  Adilen Pavelko Garry Heater

## 2020-11-16 NOTE — H&P (Signed)
THE PATIENT WAS SEEN PRIOR TO SURGERY TODAY.  HISTORY, ALLERGIES, HOME MEDICATIONS AND OPERATIVE PROCEDURE WERE REVIEWED. RISKS AND BENEFITS OF SURGERY DISCUSSED WITH PATIENT AGAIN.  NO CHANGES FROM INITIAL HISTORY AND PHYSICAL NOTED.    

## 2020-11-16 NOTE — Transfer of Care (Signed)
Immediate Anesthesia Transfer of Care Note  Patient: Shirley Kaiser  Procedure(s) Performed: rt  ARTHROPLASTY, INTERPOSITION, INTERCARPAL CARPOMETACARPAL JOINTS (Right Thumb)  Patient Location: PACU  Anesthesia Type:General  Level of Consciousness: sedated  Airway & Oxygen Therapy: Patient Spontanous Breathing and Patient connected to face mask oxygen  Post-op Assessment: Report given to RN and Post -op Vital signs reviewed and stable  Post vital signs: Reviewed and stable  Last Vitals:  Vitals Value Taken Time  BP 131/73 11/16/20 1226  Temp 36.1 C 11/16/20 1226  Pulse 76 11/16/20 1226  Resp 16 11/16/20 1226  SpO2 100 % 11/16/20 1226    Last Pain:  Vitals:   11/16/20 1226  TempSrc: Temporal  PainSc: 4          Complications: No complications documented.

## 2020-11-16 NOTE — Discharge Instructions (Signed)

## 2020-11-16 NOTE — Op Note (Signed)
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    The risks benefits and alternatives were discussed with the patient preoperatively including but not limited to the risks of infection, bleeding, nerve injury, cardiopulmonary complications, the need for revision surgery, among others, and the patient was willing to proceed.  EBL: None  TOURNIQUET TIME: 59 MIN  OPERATIVE IMPLANTS: None  OPERATIVE FINDINGS: Advanced arthritis right thumb cmc joint  OPERATIVE PROCEDURE: The patient was brought to the operating room and underwent satisfactory general anesthesia in the supine position. The operative arm was prepped and draped in a sterile fashion. The patient was noted to have an excellent palmaris longus tendon. 3 short transverse incisions were made over the course of the tendon and it was harvested under direct vision. Her tendon was fairly small and I took over fair amount of muscle with it.  It was then placed in a moist sponge on the back table. An S-shaped incision was then made over the base of the thumb metacarpal dorsally. Dissection was carried out carefully under loupe magnification, carefully sparing the sensory nerves. The tendon sheath around the abductor pollicis longus and extensor pollicis brevis was released under direct vision, including over the radial styloid. Nerves were carefully spared. Retractors were inserted and the capsule of the trapezium and  metacarpal were opened longitudinally. The radial artery and veins were carefully dissected free and retracted with a vessel loop drain. The capsule was dissected off the trapezium and the trapezium was then morselized with the oscillating saw and rongeur. The flexor carpi radialis tendon was freed up in the base of the wound. The palmaris longus tendon was passed beneath this. A 3.5 mm drill was used to create a tunnel through the base of the metacarpal. The tendon was then passed up through the metacarpal using a tendon passer. The thumb had been placed in traction which was then released. The tendon was tied upon itself and the knot was sutured to itself prevent slippage. It was then tied in multiple knots and sutured into a ball. This was then rotated into the space between the metacarpal and the scaphoid. The capsule was then carefully and thoroughly, closed with 3-0 Vicryl suture. After irrigation, the skin was closed with 5-0 nylon. The forearm wounds had been closed with a similar suture. A well-padded thumb spica splint was applied. Tourniquet was deflated with good return of blood flow to the hand. Sponge and needle counts were correct. Patient was taken to recovery in good condition.  Valinda Hoar, MD

## 2020-11-17 ENCOUNTER — Encounter: Payer: Self-pay | Admitting: Specialist

## 2024-09-24 ENCOUNTER — Encounter: Payer: Self-pay | Admitting: Family

## 2024-09-28 ENCOUNTER — Inpatient Hospital Stay
Admission: RE | Admit: 2024-09-28 | Discharge: 2024-09-28 | Disposition: A | Payer: Self-pay | Source: Ambulatory Visit | Attending: Family

## 2024-09-28 ENCOUNTER — Other Ambulatory Visit: Payer: Self-pay | Admitting: *Deleted

## 2024-09-28 DIAGNOSIS — Z1231 Encounter for screening mammogram for malignant neoplasm of breast: Secondary | ICD-10-CM

## 2024-10-14 ENCOUNTER — Other Ambulatory Visit: Payer: Self-pay | Admitting: Family

## 2024-10-14 DIAGNOSIS — D241 Benign neoplasm of right breast: Secondary | ICD-10-CM

## 2024-10-14 DIAGNOSIS — Z1231 Encounter for screening mammogram for malignant neoplasm of breast: Secondary | ICD-10-CM

## 2024-10-26 ENCOUNTER — Ambulatory Visit
